# Patient Record
Sex: Male | Born: 1945 | Race: Black or African American | Hispanic: No | Smoking: Current every day smoker
Health system: Southern US, Community
[De-identification: ages and names within clinical notes are randomized; demographics above are authoritative.]

## PROBLEM LIST (undated history)

## (undated) DIAGNOSIS — E785 Hyperlipidemia, unspecified: Secondary | ICD-10-CM

## (undated) DIAGNOSIS — I639 Cerebral infarction, unspecified: Secondary | ICD-10-CM

## (undated) DIAGNOSIS — E119 Type 2 diabetes mellitus without complications: Secondary | ICD-10-CM

## (undated) DIAGNOSIS — I10 Essential (primary) hypertension: Secondary | ICD-10-CM

## (undated) DIAGNOSIS — N400 Enlarged prostate without lower urinary tract symptoms: Secondary | ICD-10-CM

---

## 2015-04-14 ENCOUNTER — Emergency Department (HOSPITAL_COMMUNITY): Payer: Medicare Other

## 2015-04-14 ENCOUNTER — Inpatient Hospital Stay (HOSPITAL_COMMUNITY)
Admission: EM | Admit: 2015-04-14 | Discharge: 2015-04-17 | DRG: 066 | Disposition: A | Discharge status: Hospice | Payer: Medicare Other | Attending: Internal Medicine | Admitting: Internal Medicine

## 2015-04-14 ENCOUNTER — Encounter (HOSPITAL_COMMUNITY): Payer: Self-pay | Admitting: Emergency Medicine

## 2015-04-14 DIAGNOSIS — I69320 Aphasia following cerebral infarction: Secondary | ICD-10-CM

## 2015-04-14 DIAGNOSIS — Z515 Encounter for palliative care: Secondary | ICD-10-CM | POA: Diagnosis present

## 2015-04-14 DIAGNOSIS — Z8249 Family history of ischemic heart disease and other diseases of the circulatory system: Secondary | ICD-10-CM

## 2015-04-14 DIAGNOSIS — Z823 Family history of stroke: Secondary | ICD-10-CM

## 2015-04-14 DIAGNOSIS — F17210 Nicotine dependence, cigarettes, uncomplicated: Secondary | ICD-10-CM | POA: Diagnosis present

## 2015-04-14 DIAGNOSIS — I609 Nontraumatic subarachnoid hemorrhage, unspecified: Secondary | ICD-10-CM | POA: Diagnosis not present

## 2015-04-14 DIAGNOSIS — I629 Nontraumatic intracranial hemorrhage, unspecified: Secondary | ICD-10-CM

## 2015-04-14 DIAGNOSIS — Z800 Family history of malignant neoplasm of digestive organs: Secondary | ICD-10-CM

## 2015-04-14 DIAGNOSIS — E785 Hyperlipidemia, unspecified: Secondary | ICD-10-CM | POA: Diagnosis present

## 2015-04-14 DIAGNOSIS — E119 Type 2 diabetes mellitus without complications: Secondary | ICD-10-CM

## 2015-04-14 DIAGNOSIS — Z993 Dependence on wheelchair: Secondary | ICD-10-CM

## 2015-04-14 DIAGNOSIS — Z7401 Bed confinement status: Secondary | ICD-10-CM

## 2015-04-14 DIAGNOSIS — I10 Essential (primary) hypertension: Secondary | ICD-10-CM | POA: Diagnosis present

## 2015-04-14 HISTORY — DX: Essential (primary) hypertension: I10

## 2015-04-14 HISTORY — DX: Hyperlipidemia, unspecified: E78.5

## 2015-04-14 HISTORY — DX: Benign prostatic hyperplasia without lower urinary tract symptoms: N40.0

## 2015-04-14 HISTORY — DX: Type 2 diabetes mellitus without complications: E11.9

## 2015-04-14 HISTORY — DX: Cerebral infarction, unspecified: I63.9

## 2015-04-14 LAB — I-STAT CHEM 8, ED
BUN: 15 mg/dL (ref 6–20)
CALCIUM ION: 1.08 mmol/L — AB (ref 1.13–1.30)
CHLORIDE: 106 mmol/L (ref 101–111)
Creatinine, Ser: 1.1 mg/dL (ref 0.61–1.24)
GLUCOSE: 167 mg/dL — AB (ref 65–99)
HCT: 50 % (ref 39.0–52.0)
Hemoglobin: 17 g/dL (ref 13.0–17.0)
POTASSIUM: 3.2 mmol/L — AB (ref 3.5–5.1)
Sodium: 144 mmol/L (ref 135–145)
TCO2: 21 mmol/L (ref 0–100)

## 2015-04-14 LAB — COMPREHENSIVE METABOLIC PANEL
ALT: 16 U/L — ABNORMAL LOW (ref 17–63)
ANION GAP: 14 (ref 5–15)
AST: 21 U/L (ref 15–41)
Albumin: 3.6 g/dL (ref 3.5–5.0)
Alkaline Phosphatase: 79 U/L (ref 38–126)
BILIRUBIN TOTAL: 1.1 mg/dL (ref 0.3–1.2)
BUN: 12 mg/dL (ref 6–20)
CO2: 20 mmol/L — ABNORMAL LOW (ref 22–32)
Calcium: 9.2 mg/dL (ref 8.9–10.3)
Chloride: 107 mmol/L (ref 101–111)
Creatinine, Ser: 0.88 mg/dL (ref 0.61–1.24)
GFR calc Af Amer: >60 mL/min (ref >60)
Glucose, Bld: 183 mg/dL — ABNORMAL HIGH (ref 65–99)
POTASSIUM: 3.6 mmol/L (ref 3.5–5.1)
Sodium: 141 mmol/L (ref 135–145)
TOTAL PROTEIN: 6.8 g/dL (ref 6.5–8.1)

## 2015-04-14 LAB — DIFFERENTIAL
Basophils Absolute: 0 10*3/uL (ref 0.0–0.1)
Basophils Relative: 0 %
EOS ABS: 0 10*3/uL (ref 0.0–0.7)
EOS PCT: 1 %
LYMPHS ABS: 1.4 10*3/uL (ref 0.7–4.0)
Lymphocytes Relative: 24 %
MONOS PCT: 4 %
Monocytes Absolute: 0.2 10*3/uL (ref 0.1–1.0)
NEUTROS PCT: 71 %
Neutro Abs: 4.2 10*3/uL (ref 1.7–7.7)

## 2015-04-14 LAB — PROTIME-INR
INR: 1.1 (ref 0.00–1.49)
Prothrombin Time: 14.4 seconds (ref 11.6–15.2)

## 2015-04-14 LAB — I-STAT TROPONIN, ED: TROPONIN I, POC: 0.03 ng/mL (ref 0.00–0.08)

## 2015-04-14 LAB — CBC
HEMATOCRIT: 45.9 % (ref 39.0–52.0)
HEMOGLOBIN: 15.4 g/dL (ref 13.0–17.0)
MCH: 30.3 pg (ref 26.0–34.0)
MCHC: 33.6 g/dL (ref 30.0–36.0)
MCV: 90.4 fL (ref 78.0–100.0)
Platelets: 140 10*3/uL — ABNORMAL LOW (ref 150–400)
RBC: 5.08 MIL/uL (ref 4.22–5.81)
RDW: 12.7 % (ref 11.5–15.5)
WBC: 5.9 10*3/uL (ref 4.0–10.5)

## 2015-04-14 LAB — ETHANOL

## 2015-04-14 LAB — APTT: aPTT: 31 seconds (ref 24–37)

## 2015-04-14 MED ORDER — SODIUM CHLORIDE 0.9 % IV SOLN
1500.0000 mg | Freq: Once | INTRAVENOUS | Status: AC
  Administered 2015-04-14: 1500 mg via INTRAVENOUS
  Filled 2015-04-14: qty 15

## 2015-04-14 MED ORDER — IOHEXOL 350 MG/ML SOLN
50.0000 mL | Freq: Once | INTRAVENOUS | Status: AC | PRN
  Administered 2015-04-14: 50 mL via INTRAVENOUS

## 2015-04-14 MED ORDER — NICARDIPINE HCL IN NACL 20-0.86 MG/200ML-% IV SOLN
3.0000 mg/h | Freq: Once | INTRAVENOUS | Status: AC
  Administered 2015-04-14: 5 mg/h via INTRAVENOUS
  Filled 2015-04-14: qty 200

## 2015-04-14 MED ORDER — MORPHINE SULFATE (PF) 2 MG/ML IV SOLN
2.0000 mg | Freq: Once | INTRAVENOUS | Status: AC
  Administered 2015-04-14: 2 mg via INTRAVENOUS
  Filled 2015-04-14: qty 1

## 2015-04-14 NOTE — ED Notes (Signed)
Patient transported to CT 

## 2015-04-14 NOTE — ED Notes (Signed)
Pt arrives by [facility], family called because pt was signaling that his head was hurting and began vomiting. Family also reports increased lethargy. Pt had a stroke several years ago and is nonverbal with right side deficits at baseline. EMS also reports AMS intermittently. EMS placed 20g in left AC, gave  zofran without relief. Initial BP 214/127, last BP 206/123. CBG 175.

## 2015-04-14 NOTE — ED Provider Notes (Signed)
CSN: [id number]     Arrival date & time 04/14/15  2116 History   First MD Initiated Contact with Patient 04/14/15 2126     Chief Complaint  Patient presents with  . Headache     The history is provided by the EMS personnel and a relative. No language interpreter was used.  Chad Villarreal is a 70 y.o. male who presents to the Emergency Department complaining of headache. History is provided by EMS and family. Per reports patient developed a sudden onset headache about one hour prior to arrival. He has expressive aphasia from prior stroke and is able to nod yes and no. Family states that he vomited and then held his head and appeared to be in pain. When asked if he has a headache he nods yes. He denies chest pain or abdominal pain. No known trauma recently.   Past Medical History  Diagnosis Date  . Stroke (HCC)   . Diabetes mellitus without complication (HCC)   . Hyperlipidemia   . Hypertension   . Enlarged prostate    History reviewed. No pertinent past surgical history. History reviewed. No pertinent family history. Social History  Substance Use Topics  . Smoking status: Current Every Day Smoker -- 0.50 packs/day    Types: Cigarettes  . Smokeless tobacco: None  . Alcohol Use: No    Review of Systems  Reason unable to perform ROS: Altered mental status.      Allergies  Review of patient's allergies indicates no known allergies.  Home Medications   Prior to Admission medications   Medication Sig Start Date End Date Taking? Authorizing Provider  albuterol (PROVENTIL) (2.5 MG/3ML) 0.083% nebulizer solution Take 2.5 mg by nebulization every 6 (six) hours as needed for wheezing or shortness of breath.   Yes Historical Provider, MD  Dextromethorphan-Guaifenesin (MUCINEX DM) 30-600 MG TB12 Take 1 tablet by mouth 2 (two) times daily as needed (FOR MUCUS RELIEF).   Yes Historical Provider, MD  insulin aspart (NOVOLOG) 100 UNIT/ML injection Inject 5 Units into the skin 3 (three) times  daily before meals. SLIDING SCALE   Yes Historical Provider, MD  Insulin Degludec (TRESIBA FLEXTOUCH) 100 UNIT/ML SOPN Inject 20 Units into the skin daily.   Yes Historical Provider, MD  amLODipine (NORVASC) 5 MG tablet Take 5 mg by mouth daily. Reported on 04/14/2015    Historical Provider, MD  atorvastatin (LIPITOR) 20 MG tablet Take 20 mg by mouth daily at 6 PM. Reported on 04/14/2015    Historical Provider, MD  clopidogrel (PLAVIX) 75 MG tablet Take 75 mg by mouth daily. Reported on 04/14/2015    Historical Provider, MD  metFORMIN (GLUCOPHAGE) 1000 MG tablet Take 1,000 mg by mouth 2 (two) times daily with a meal. Reported on 04/14/2015    Historical Provider, MD  Multiple Vitamin (MULTIVITAMIN WITH MINERALS) TABS tablet Take 1 tablet by mouth daily. Reported on 04/14/2015    Historical Provider, MD  Probiotic Product (PROBIOTIC FORMULA) CAPS Take 1 capsule by mouth daily. Reported on 04/14/2015    Historical Provider, MD  sertraline (ZOLOFT) 50 MG tablet Take 50 mg by mouth daily. Reported on 04/14/2015    Historical Provider, MD   BP 148/81 mmHg  Pulse 128  Resp 27  SpO2 99% Physical Exam  Constitutional: He appears well-developed and well-nourished. He appears distressed.  HENT:  Head: Normocephalic and atraumatic.  Cardiovascular: Regular rhythm.   No murmur heard. Tachycardic  Pulmonary/Chest: Effort normal. No respiratory distress.  Rhonchi bilaterally  Abdominal: Soft. There  is no tenderness. There is no rebound and no guarding.  Musculoskeletal: He exhibits no tenderness.  Neurological: He is alert.  Right-sided hemiparesis. Nonverbal. Follows simple one-step commands. 5 out of 5 strength in left upper and left lower extremity.  Skin: Skin is warm and dry.  Psychiatric: He has a normal mood and affect. His behavior is normal.  Nursing note and vitals reviewed.   ED Course  Procedures  CRITICAL CARE Performed by: Tilden Fossa   Total critical care time: 30  minutes  Critical care time was exclusive of separately billable procedures and treating other patients.  Critical care was necessary to treat or prevent imminent or life-threatening deterioration.  Critical care was time spent personally by me on the following activities: development of treatment plan with patient and/or surrogate as well as nursing, discussions with consultants, evaluation of patient's response to treatment, examination of patient, obtaining history from patient or surrogate, ordering and performing treatments and interventions, ordering and review of laboratory studies, ordering and review of radiographic studies, pulse oximetry and re-evaluation of patient's condition.  Labs Review Labs Reviewed  CBC - Abnormal; Notable for the following:    Platelets 140 (*)    All other components within normal limits  COMPREHENSIVE METABOLIC PANEL - Abnormal; Notable for the following:    CO2 20 (*)    Glucose, Bld 183 (*)    ALT 16 (*)    All other components within normal limits  I-STAT CHEM 8, ED - Abnormal; Notable for the following:    Potassium 3.2 (*)    Glucose, Bld 167 (*)    Calcium, Ion 1.08 (*)    All other components within normal limits  ETHANOL  PROTIME-INR  APTT  DIFFERENTIAL  URINE RAPID DRUG SCREEN, HOSP PERFORMED  URINALYSIS, ROUTINE W REFLEX MICROSCOPIC (NOT AT [facility])  I-STAT TROPOININ, ED    Imaging Review Ct Angio Head W/cm &/or Wo Cm  04/14/2015  CLINICAL DATA:  Follow-up intracranial hemorrhage. History of diabetes, hypertension and hyperlipidemia. EXAM: CT ANGIOGRAPHY HEAD TECHNIQUE: Multidetector CT imaging of the head was performed using the standard protocol during bolus administration of intravenous contrast. Multiplanar CT image reconstructions and MIPs were obtained to evaluate the vascular anatomy. CONTRAST:  50mL OMNIPAQUE IOHEXOL 350 MG/ML SOLN COMPARISON:  CT head April 14, 2015 at 2146 hours FINDINGS: Anterior circulation: LEFT internal  carotid artery is occluded throughout the included cervical and intracranial segments. Small amount of retrograde flow LEFT carotid terminus. Moderate calcific atherosclerosis of RIGHT carotid bulb, with moderate stenosis RIGHT cavernous segment. Moderate luminal irregularity patent RIGHT A1 segment, with focal high-grade stenosis RIGHT anterior cerebral artery. High-grade stenosis versus occluded LEFT A1 segment, difficult to assess due to subarachnoid hemorrhage. Moderate luminal irregularity RIGHT middle cerebral artery, with tandem high-grade stenosis RIGHT M2 segment. Moderate to high-grade stenosis LEFT M1 origin, with tandem high-grade stenosis LEFT M2 and M3 segments. Posterior circulation: Occluded RIGHT vertebral artery, from included RIGHT V3 through RIGHT V4 segment, with minimal retrograde flow distal RIGHT V4 segment. High-grade stenosis distal LEFT V4 segment. Basilar artery occlusion with retrograde flow via probable small LEFT posterior communicating artery. High-grade stenosis distal basilar artery. Thready flow bilateral posterior cerebral arteries without large vessel occlusion. 7 mm LEFT inferior frontal lobe rounded density with nipple sign posteriorly directed extending into the LEFT olfactory recess. IMPRESSION: 7 mm LEFT inferior frontal lobe density with nipple sign compatible with source of hemorrhage. Consider location this is concerning for pseudo aneurysm, less likely vascular malformation. Occluded  RIGHT internal carotid artery. Occluded RIGHT vertebral artery. Occluded basilar artery. Patent LEFT posterior communicating artery. Multifocal high-grade stenosis compatible with atherosclerosis. Electronically Signed   By: Awilda Metro M.D.   On: 04/14/2015 23:04   Ct Head Wo Contrast  04/14/2015  CLINICAL DATA:  Headache, vomiting and lethargy. History of old stroke, RIGHT-sided deficits at baseline. History of hypertension, hyperlipidemia and diabetes. EXAM: CT HEAD WITHOUT  CONTRAST TECHNIQUE: Contiguous axial images were obtained from the base of the skull through the vertex without intravenous contrast. COMPARISON:  None. FINDINGS: Large amount of subarachnoid hemorrhage about the circle of Willis, extending into the LEFT greater than RIGHT sylvian fissures, and along the interhemispheric fissure. Acute bilateral dense frontal subdural hematomas measuring up to 4 mm without mass effect. 3.9 x 1 cm LEFT inferior frontal lobe intraparenchymal hematoma. No midline shift. Large area of LEFT frontoparietal encephalomalacia with ex vacuo dilatation LEFT lateral ventricle, no hydrocephalus. Probable LEFT cerebral peduncle volume loss compatible with wallerian degeneration. Patchy supratentorial white matter hypodensities exclusive of the aforementioned abnormality. Moderate calcific atherosclerosis of the carotid siphons and included vertebral arteries. No skull fracture. Sessile LEFT frontal bony excrescence measuring up to 6 mm in depth without mass effect may represent meningioma. Imaged paranasal sinuses and mastoid air cells are well aerated. Ocular globes and orbital contents are normal. IMPRESSION: Large amount of subarachnoid hemorrhage about the circle of Willis extends into the interhemispheric fissure suggesting ruptured anterior communicating artery aneurysm. 3.9 x 1 cm LEFT inferior frontal lobe intraparenchymal hematoma, which may be posttraumatic or, intraparenchymal dissection of ruptured aneurysm. 4 mm bifrontal acute subdural hematomas without mass effect. Acute findings discussed with and reconfirmed by [facility] Jonte Wollam on 04/14/2015 at 9:55 pm. Electronically Signed   By: Awilda Metro M.D.   On: 04/14/2015 21:59   Dg Chest Port 1 View  04/14/2015  CLINICAL DATA:  Chest pain and altered mental status EXAM: PORTABLE CHEST 1 VIEW COMPARISON:  None. FINDINGS: Cardiac shadow is enlarged. The lungs are well aerated with mild vascular congestion. Mild increased density  is noted in the medial right lung base likely representing atelectasis or early infiltrate. No bony abnormality is noted. IMPRESSION: Mild vascular congestion and early right basilar changes. Electronically Signed   By: Alcide Clever M.D.   On: 04/14/2015 21:56   I have personally reviewed and evaluated these images and lab results as part of my medical decision-making.   EKG Interpretation   Date/Time:  Friday April 14 2015 21:29:12 EST Ventricular Rate:  127 PR Interval:  145 QRS Duration: 89 QT Interval:  322 QTC Calculation: 468 R Axis:   -29 Text Interpretation:  Sinus tachycardia LAE, consider biatrial enlargement  Probable left ventricular hypertrophy Borderline T abnormalities, diffuse  leads Confirmed by Lincoln Brigham [phone]) on 04/14/2015 9:42:17 PM      MDM   Final diagnoses:  SAH (subarachnoid hemorrhage) (HCC)    Patient here for violation of sudden onset headache. He is ill-appearing on initial evaluation. CT head is consistent with acute subarachnoid hemorrhage, concern for aneurysmal hemorrhage. He was started on a Cardene drip for blood pressure control, Keppra for seizure prophylaxis. Neurosurgery consulted for further management recommendations. On repeat evaluation he is slightly more somnolent but he does arouse to verbal stimuli.  Neurosurgery has evaluated the patient and there are limited interventions available. After discussions with the family decision has been made to transition the patient to comfort and palliative care. Medicine consultation for admission for comfort care.  Tilden Fossa, MD 04/15/15 [phone]

## 2015-04-14 NOTE — Consult Note (Signed)
CC:  Chief Complaint  Patient presents with  . Headache    HPI: Chad Villarreal is a 70 y.o. 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  He began making motions consistent with headache today and had transient episodes of decreased consciousness.  He was found to have a subarachnoid hemorrhage from a left frontoorbital pseudoaneurysm.  PMH: Past Medical History  Diagnosis Date  . Stroke (HCC)   . Diabetes mellitus without complication (HCC)   . Hyperlipidemia   . Hypertension   . Enlarged prostate     PSH: History reviewed. No pertinent past surgical history.  SH: Social History  Substance Use Topics  . Smoking status: Current Every Day Smoker -- 0.50 packs/day    Types: Cigarettes  . Smokeless tobacco: None  . Alcohol Use: No    MEDS: Prior to Admission medications   Medication Sig Start Date End Date Taking? Authorizing Provider  albuterol (PROVENTIL) (2.5 MG/3ML) 0.083% nebulizer solution Take 2.5 mg by nebulization every 6 (six) hours as needed for wheezing or shortness of breath.   Yes Historical Provider, MD  Dextromethorphan-Guaifenesin (MUCINEX DM) 30-600 MG TB12 Take 1 tablet by mouth 2 (two) times daily as needed (FOR MUCUS RELIEF).   Yes Historical Provider, MD  insulin aspart (NOVOLOG) 100 UNIT/ML injection Inject 5 Units into the skin 3 (three) times daily before meals. SLIDING SCALE   Yes Historical Provider, MD  Insulin Degludec (TRESIBA FLEXTOUCH) 100 UNIT/ML SOPN Inject 20 Units into the skin daily.   Yes Historical Provider, MD  amLODipine (NORVASC) 5 MG tablet Take 5 mg by mouth daily. Reported on 04/14/2015    Historical Provider, MD  atorvastatin (LIPITOR) 20 MG tablet Take 20 mg by mouth daily at 6 PM. Reported on 04/14/2015    Historical Provider, MD  clopidogrel (PLAVIX) 75 MG tablet Take 75 mg by mouth daily. Reported on 04/14/2015    Historical Provider, MD  metFORMIN (GLUCOPHAGE) 1000 MG tablet Take 1,000 mg by mouth 2 (two) times  daily with a meal. Reported on 04/14/2015    Historical Provider, MD  Multiple Vitamin (MULTIVITAMIN WITH MINERALS) TABS tablet Take 1 tablet by mouth daily. Reported on 04/14/2015    Historical Provider, MD  Probiotic Product (PROBIOTIC FORMULA) CAPS Take 1 capsule by mouth daily. Reported on 04/14/2015    Historical Provider, MD  sertraline (ZOLOFT) 50 MG tablet Take 50 mg by mouth daily. Reported on 04/14/2015    Historical Provider, MD    ALLERGY: No Known Allergies  ROS: ROS  Unable to obtain because patient is aphasic  NEUROLOGIC EXAM: Awake, fluctuating alertness Nods appropriately in response to family members PERRL Moves purposefully on the left, dense right hemiparesis with contractures  IMAGING: Subarachnoid hemorrhage and left inferior frontal parenchymal hemorrhage Anterior inferior left frontal pseudoaneurysm.  Left carotid occlusion.  Right vertebral artery occlusion.  IMPRESSION: - 70 y.o. male with suabarachnoid hemorrhage due to frontal pseudoaneurysm.  Given his poor cerebral vasculature and multiple comorbidities as well as the unusual, difficult location of this aneurysm he is not a reasonable surgical candidate.  Family discussed this with him and he indicated to them that he does not want any aggressive treatment.  I think this is a reasonable decision.  I answered all of their questions.  PLAN: - Palliative care - No surgical intervention

## 2015-04-15 ENCOUNTER — Encounter (HOSPITAL_COMMUNITY): Payer: Self-pay | Admitting: Internal Medicine

## 2015-04-15 DIAGNOSIS — E119 Type 2 diabetes mellitus without complications: Secondary | ICD-10-CM

## 2015-04-15 DIAGNOSIS — E785 Hyperlipidemia, unspecified: Secondary | ICD-10-CM | POA: Diagnosis present

## 2015-04-15 DIAGNOSIS — Z8249 Family history of ischemic heart disease and other diseases of the circulatory system: Secondary | ICD-10-CM | POA: Diagnosis not present

## 2015-04-15 DIAGNOSIS — Z993 Dependence on wheelchair: Secondary | ICD-10-CM | POA: Diagnosis not present

## 2015-04-15 DIAGNOSIS — I609 Nontraumatic subarachnoid hemorrhage, unspecified: Secondary | ICD-10-CM | POA: Diagnosis present

## 2015-04-15 DIAGNOSIS — Z515 Encounter for palliative care: Secondary | ICD-10-CM | POA: Diagnosis present

## 2015-04-15 DIAGNOSIS — Z823 Family history of stroke: Secondary | ICD-10-CM | POA: Diagnosis not present

## 2015-04-15 DIAGNOSIS — I10 Essential (primary) hypertension: Secondary | ICD-10-CM

## 2015-04-15 DIAGNOSIS — I629 Nontraumatic intracranial hemorrhage, unspecified: Secondary | ICD-10-CM

## 2015-04-15 DIAGNOSIS — Z800 Family history of malignant neoplasm of digestive organs: Secondary | ICD-10-CM | POA: Diagnosis not present

## 2015-04-15 DIAGNOSIS — Z7401 Bed confinement status: Secondary | ICD-10-CM | POA: Diagnosis not present

## 2015-04-15 DIAGNOSIS — F17210 Nicotine dependence, cigarettes, uncomplicated: Secondary | ICD-10-CM | POA: Diagnosis present

## 2015-04-15 DIAGNOSIS — I69320 Aphasia following cerebral infarction: Secondary | ICD-10-CM | POA: Diagnosis not present

## 2015-04-15 MED ORDER — ACETAMINOPHEN 325 MG PO TABS: 650.0000 mg | ORAL_TABLET | Freq: Four times a day (QID) | ORAL | Status: DC | PRN

## 2015-04-15 MED ORDER — ACETAMINOPHEN 650 MG RE SUPP: 650.0000 mg | Freq: Four times a day (QID) | RECTAL | Status: DC | PRN

## 2015-04-15 MED ORDER — SODIUM CHLORIDE 0.9% FLUSH: 3.0000 mL | INTRAVENOUS | Status: DC | PRN

## 2015-04-15 MED ORDER — SODIUM CHLORIDE 0.9 % IV SOLN: 250.0000 mL | INTRAVENOUS | Status: DC | PRN

## 2015-04-15 MED ORDER — SODIUM CHLORIDE 0.9 % IV SOLN
500.0000 mg | Freq: Two times a day (BID) | INTRAVENOUS | Status: DC
  Administered 2015-04-15 – 2015-04-16 (×4): 500 mg via INTRAVENOUS
  Filled 2015-04-15 (×5): qty 5

## 2015-04-15 MED ORDER — MORPHINE SULFATE (PF) 2 MG/ML IV SOLN
2.0000 mg | INTRAVENOUS | Status: DC | PRN
Start: 2015-04-15 — End: 2015-04-17
  Administered 2015-04-15: 2 mg via INTRAVENOUS
  Administered 2015-04-16 (×2): 4 mg via INTRAVENOUS
  Filled 2015-04-15 (×2): qty 2
  Filled 2015-04-15: qty 1

## 2015-04-15 MED ORDER — ONDANSETRON HCL 4 MG/2ML IJ SOLN
4.0000 mg | Freq: Four times a day (QID) | INTRAMUSCULAR | Status: DC | PRN
  Administered 2015-04-15: 4 mg via INTRAVENOUS
  Filled 2015-04-15: qty 2

## 2015-04-15 MED ORDER — ONDANSETRON HCL 4 MG PO TABS
4.0000 mg | ORAL_TABLET | Freq: Four times a day (QID) | ORAL | Status: DC | PRN
Start: 2015-04-15 — End: 2015-04-17

## 2015-04-15 MED ORDER — ALBUTEROL SULFATE (2.5 MG/3ML) 0.083% IN NEBU: 2.5000 mg | INHALATION_SOLUTION | RESPIRATORY_TRACT | Status: DC | PRN

## 2015-04-15 MED ORDER — LORAZEPAM 2 MG/ML IJ SOLN: 0.5000 mg | INTRAMUSCULAR | Status: DC | PRN

## 2015-04-15 MED ORDER — SODIUM CHLORIDE 0.9% FLUSH
3.0000 mL | Freq: Two times a day (BID) | INTRAVENOUS | Status: DC
  Administered 2015-04-15 – 2015-04-16 (×2): 3 mL via INTRAVENOUS

## 2015-04-15 NOTE — Progress Notes (Signed)
PROGRESS NOTE    Chad Villarreal  WJX:[id number]  DOB: Nov 15, 1945  DOA: 04/14/2015 PCP: No primary care provider on file. Outpatient Specialists:   Hospital course: 70 year old male with history of prior stroke, right hemiparesis & aphagia, bed bound for several years, DM, HTN, HLD, presented to [facility] ED on 2/24 with complaints suggestive of headache and decreased consciousness. 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 Neurosurgery consulted in ED and determined that he was not the reasonable surgical candidate. Family discussed this with patient and he indicated to them that he did not want any aggressive treatment. Patient was transitioned to comfort care.   Assessment & Plan:   Subarachnoid hemorrhage secondary to left frontal orbital pseudoaneurysm - Neurosurgery evaluated in ED and patient not a surgical candidate. Transitioned to palliative care. Will request social work consultation for home hospice.  Hypertension - No aggressive intervention  DM - No aggressive intervention  DVT prophylaxis: SCDs Code Status: DO NOT RESUSCITATE Family Communication: Discussed with spouse at bedside Disposition Plan: Home with hospice   Consultants:  Neurosurgery  Procedures:  None  Antimicrobials:  None   Subjective: Patient sleeping and barely arousable. As per spouse at bedside, intermittently wakes up and still recognizes her.  Objective: Filed Vitals:   04/15/15 0100 04/15/15 0130 04/15/15 0225 04/15/15 1456  BP: 155/100 133/81 163/98 134/92  Pulse: 131 127 125 118  Temp:   97.9 F (36.6 C) 98.2 F (36.8 C)  TempSrc:   Axillary Oral  Resp: 29 21 20 18   Weight:   83 kg (182 lb 15.7 oz)   SpO2: 99% 98% 100% 100%    Intake/Output Summary (Last 24 hours) at 04/15/15 1903 Last data filed at 04/15/15 1902  Gross per 24 hour  Intake    462 ml  Output   1150 ml  Net   -688 ml   Filed Weights   04/15/15 0225  Weight: 83  kg (182 lb 15.7 oz)    Exam:  General exam: Elderly male lying comfortably in bed. No distress noted. Respiratory system: Reduced breath sounds. No increased work of breathing. Cardiovascular system: S1 & S2 heard, RRR. No JVD, murmurs, gallops, clicks or pedal edema. Gastrointestinal system: Abdomen is nondistended, soft and nontender. Normal bowel sounds heard. Central nervous system: Sleeping and barely arousable.  Extremities: Symmetric 5 x 5 power. Contracted right upper extremity   Data Reviewed: Basic Metabolic Panel:  Recent Labs Lab 04/14/15 2146 04/14/15 2305  NA 144 141  K 3.2* 3.6  CL 106 107  CO2  --  20*  GLUCOSE 167* 183*  BUN 15 12  CREATININE 1.10 0.88  CALCIUM  --  9.2   Liver Function Tests:  Recent Labs Lab 04/14/15 2305  AST 21  ALT 16*  ALKPHOS 79  BILITOT 1.1  PROT 6.8  ALBUMIN 3.6   No results for input(s): LIPASE, AMYLASE in the last 168 hours. No results for input(s): AMMONIA in the last 168 hours. CBC:  Recent Labs Lab 04/14/15 2146 04/14/15 2305  WBC  --  5.9  NEUTROABS  --  4.2  HGB 17.0 15.4  HCT 50.0 45.9  MCV  --  90.4  PLT  --  140*   Cardiac Enzymes: No results for input(s): CKTOTAL, CKMB, CKMBINDEX, TROPONINI in the last 168 hours. BNP (last 3 results) No results for input(s): PROBNP in the last 8760 hours. CBG: No results for input(s): GLUCAP in the last 168 hours.  No  results found for this or any previous visit (from the past 240 hour(s)).       Studies: Ct Angio Head W/cm &/or Wo Cm  04/14/2015  CLINICAL DATA:  Follow-up intracranial hemorrhage. History of diabetes, hypertension and hyperlipidemia. EXAM: CT ANGIOGRAPHY HEAD TECHNIQUE: Multidetector CT imaging of the head was performed using the standard protocol during bolus administration of intravenous contrast. Multiplanar CT image reconstructions and MIPs were obtained to evaluate the vascular anatomy. CONTRAST:  50mL OMNIPAQUE IOHEXOL 350 MG/ML SOLN  COMPARISON:  CT head April 14, 2015 at 2146 hours FINDINGS: Anterior circulation: LEFT internal carotid artery is occluded throughout the included cervical and intracranial segments. Small amount of retrograde flow LEFT carotid terminus. Moderate calcific atherosclerosis of RIGHT carotid bulb, with moderate stenosis RIGHT cavernous segment. Moderate luminal irregularity patent RIGHT A1 segment, with focal high-grade stenosis RIGHT anterior cerebral artery. High-grade stenosis versus occluded LEFT A1 segment, difficult to assess due to subarachnoid hemorrhage. Moderate luminal irregularity RIGHT middle cerebral artery, with tandem high-grade stenosis RIGHT M2 segment. Moderate to high-grade stenosis LEFT M1 origin, with tandem high-grade stenosis LEFT M2 and M3 segments. Posterior circulation: Occluded RIGHT vertebral artery, from included RIGHT V3 through RIGHT V4 segment, with minimal retrograde flow distal RIGHT V4 segment. High-grade stenosis distal LEFT V4 segment. Basilar artery occlusion with retrograde flow via probable small LEFT posterior communicating artery. High-grade stenosis distal basilar artery. Thready flow bilateral posterior cerebral arteries without large vessel occlusion. 7 mm LEFT inferior frontal lobe rounded density with nipple sign posteriorly directed extending into the LEFT olfactory recess. IMPRESSION: 7 mm LEFT inferior frontal lobe density with nipple sign compatible with source of hemorrhage. Consider location this is concerning for pseudo aneurysm, less likely vascular malformation. 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 Patent LEFT posterior communicating artery. Multifocal high-grade stenosis compatible with atherosclerosis. Electronically Signed   By: Awilda Metro M.D.   On: 04/14/2015 23:04   Ct Head Wo Contrast  04/14/2015  CLINICAL DATA:  Headache, vomiting and lethargy. History of old stroke, RIGHT-sided  deficits at baseline. History of hypertension, hyperlipidemia and diabetes. EXAM: CT HEAD WITHOUT CONTRAST TECHNIQUE: Contiguous axial images were obtained from the base of the skull through the vertex without intravenous contrast. COMPARISON:  None. FINDINGS: Large amount of subarachnoid hemorrhage about the circle of Willis, extending into the LEFT greater than RIGHT sylvian fissures, and along the interhemispheric fissure. Acute bilateral dense frontal subdural hematomas measuring up to 4 mm without mass effect. 3.9 x 1 cm LEFT inferior frontal lobe intraparenchymal hematoma. No midline shift. Large area of LEFT frontoparietal encephalomalacia with ex vacuo dilatation LEFT lateral ventricle, no hydrocephalus. Probable LEFT cerebral peduncle volume loss compatible with wallerian degeneration. Patchy supratentorial white matter hypodensities exclusive of the aforementioned abnormality. Moderate calcific atherosclerosis of the carotid siphons and included vertebral arteries. No skull fracture. Sessile LEFT frontal bony excrescence measuring up to 6 mm in depth without mass effect may represent meningioma. Imaged paranasal sinuses and mastoid air cells are well aerated. Ocular globes and orbital contents are normal. IMPRESSION: Large amount of subarachnoid hemorrhage about the circle of Willis extends into the interhemispheric fissure suggesting ruptured anterior communicating artery aneurysm. 3.9 x 1 cm LEFT inferior frontal lobe intraparenchymal hematoma, which may be posttraumatic or, intraparenchymal dissection of ruptured aneurysm. 4 mm bifrontal acute subdural hematomas without mass effect. Acute findings discussed with and reconfirmed by [facility] on 04/14/2015 at 9:55 pm. Electronically Signed   By: Michel Santee.D.  On: 04/14/2015 21:59   Dg Chest Port 1 View  04/14/2015  CLINICAL DATA:  Chest pain and altered mental status EXAM: PORTABLE CHEST 1 VIEW COMPARISON:  None. FINDINGS: Cardiac  shadow is enlarged. The lungs are well aerated with mild vascular congestion. Mild increased density is noted in the medial right lung base likely representing atelectasis or early infiltrate. No bony abnormality is noted. IMPRESSION: Mild vascular congestion and early right basilar changes. Electronically Signed   By: Alcide Clever M.D.   On: 04/14/2015 21:56        Scheduled Meds: . levETIRAcetam  500 mg Intravenous Q12H  . sodium chloride flush  3 mL Intravenous Q12H   Continuous Infusions:   Active Problems:   Diabetes mellitus (HCC)   Hypertension   Intracranial bleed (HCC)   SAH (subarachnoid hemorrhage) (HCC)   Subarachnoid bleed (HCC)    Time spent: 20 minutes    Raylyn Carton, MD, FACP, FHM. [facility] Pager [phone]  If 7PM-7AM, please contact night-coverage [email or web address] Password [email or web address] 04/15/2015, 7:03 PM    LOS: 0 days

## 2015-04-15 NOTE — H&P (Signed)
PCP: No primary care provider on file.    Referring provider Pecola Leisure   Chief Complaint:  Nausea vomiting headache  HPI: Chad Villarreal is a 70 y.o. male   has a past medical history of Stroke (HCC); Diabetes mellitus without complication (HCC); Hyperlipidemia; Hypertension; and Enlarged prostate.   Presented with what appeared to be headache AND episode of vomiting. 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 Neurosurgery has been consulted but given for cerebrovascular Gen. spoke be due to his father not to be candidate for any surgical intervention or coiling. His was also discussed with the patient himself who indicated he did not want any aggressive intervention and patient wishes at this point to have comfort care only  IN ER: 7 mm left inferior frontal lobe density with compatible with source of hemorrhage most likely vascular formation She was started on nicardipine drip but this was discontinued when decision was made for patient to have comfort care only  Regarding pertinent past history: Asian baseline has history of aphasia but able to be functional and answering yes and no past history of RIGHThemiparesis secondary to prior stroke. Patient has history of  diabetes   Hospitalist was called for admission for subarachnoid bleed  Review of Systems:  Patient nonverbal and unable to obtain Past Medical History: Past Medical History  Diagnosis Date  . Stroke (HCC)   . Diabetes mellitus without complication (HCC)   . Hyperlipidemia   . Hypertension   . Enlarged prostate    History reviewed. No pertinent past surgical history.   Medications: Prior to Admission medications   Medication Sig Start Date End Date Taking? Authorizing Provider  albuterol (PROVENTIL) (2.5 MG/3ML) 0.083% nebulizer  solution Take 2.5 mg by nebulization every 6 (six) hours as needed for wheezing or shortness of breath.   Yes Historical Provider, MD  Dextromethorphan-Guaifenesin (MUCINEX DM) 30-600 MG TB12 Take 1 tablet by mouth 2 (two) times daily as needed (FOR MUCUS RELIEF).   Yes Historical Provider, MD  insulin aspart (NOVOLOG) 100 UNIT/ML injection Inject 5 Units into the skin 3 (three) times daily before meals. SLIDING SCALE   Yes Historical Provider, MD  Insulin Degludec (TRESIBA FLEXTOUCH) 100 UNIT/ML SOPN Inject 20 Units into the skin daily.   Yes Historical Provider, MD  amLODipine (NORVASC) 5 MG tablet Take 5 mg by mouth daily. Reported on 04/14/2015    Historical Provider, MD  atorvastatin (LIPITOR) 20 MG tablet Take 20 mg by mouth daily at 6 PM. Reported on 04/14/2015    Historical Provider, MD  clopidogrel (PLAVIX) 75 MG tablet Take 75 mg by mouth daily. Reported on 04/14/2015    Historical Provider, MD  metFORMIN (GLUCOPHAGE) 1000 MG tablet Take 1,000 mg by mouth 2 (two) times daily with a meal. Reported on 04/14/2015    Historical Provider, MD  Multiple Vitamin (MULTIVITAMIN WITH MINERALS) TABS tablet Take 1 tablet by mouth daily. Reported on 04/14/2015    Historical Provider, MD  Probiotic Product (PROBIOTIC FORMULA) CAPS Take 1 capsule by mouth daily. Reported on 04/14/2015    Historical Provider, MD  sertraline (ZOLOFT) 50 MG tablet Take 50 mg by mouth daily. Reported on 04/14/2015    Historical Provider, MD    Allergies:  No Known Allergies  Social History:  Ambulatory wheelchair bound  Lives at home With family  reports that he has been smoking Cigarettes.  He has been smoking about 0.50 packs per day. He does not have any smokeless tobacco history on file. He reports that he does not drink alcohol or use illicit drugs.     Family History: family history includes Colon cancer in his mother; Hypertension in his mother; Stroke in his other.    Physical Exam: Patient Vitals for the  past 24 hrs:  BP Pulse Resp SpO2  04/14/15 2345 148/81 mmHg (!) 128 (!) 27 99 %  04/14/15 2331 138/84 mmHg (!) 129 (!) 31 99 %  04/14/15 2315 158/77 mmHg (!) 130 22 99 %  04/14/15 2307 142/69 mmHg (!) 130 (!) 27 98 %  04/14/15 2257 157/91 mmHg 66 (!) 30 99 %  04/14/15 2236 176/100 mmHg (!) 127 (!) 30 94 %  04/14/15 2220 158/90 mmHg 63 (!) 36 95 %  04/14/15 2218 167/93 mmHg - - 99 %  04/14/15 2211 198/97 mmHg - - -  04/14/15 2209 198/97 mmHg (!) 125 (!) 36 97 %  04/14/15 2202 - (!) 123 (!) 30 97 %  04/14/15 2201 (!) 180/106 mmHg - - -    1. General:  in No Acute distress 2. Psychological: Somnolent not Oriented 3. Head/ENT:    Dry Mucous Membranes                          Head Non traumatic, neck supple                          Normal  Dentition 4. SKIN: normal  Skin turgor,  Skin clean Dry and intact no rash 5. Heart: Regular rate and rhythm no Murmur, Rub or gallop 6. Lungs: Clear to auscultation bilaterally, no wheezes or crackles   7. Abdomen: Soft, non-tender, Non distended 8. Lower extremities: no clubbing, cyanosis, or edema 9. Neurologically chronic right hemiparesis with contractures  10. MSK: Normal range of motion  body mass index is unknown because there is no height or weight on file.   Labs on Admission:   Results for orders placed or performed during the hospital encounter of 04/14/15 (from the past 24 hour(s))  I-stat troponin, ED (not at [facility], [facility])     Status: None   Collection Time: 04/14/15  9:40 PM  Result Value Ref Range   Troponin i, poc 0.03 0.00 - 0.08 ng/mL   Comment 3          I-Stat Chem 8, ED  (not at [facility], [facility])     Status: Abnormal   Collection Time: 04/14/15  9:46 PM  Result Value Ref Range   Sodium 144 135 - 145 mmol/L   Potassium 3.2 (L) 3.5 - 5.1 mmol/L   Chloride 106 101 - 111 mmol/L   BUN 15 6 - 20 mg/dL   Creatinine, Ser 1.61 0.61 - 1.24 mg/dL   Glucose, Bld 096 (H) 65 - 99 mg/dL   Calcium, Ion 0.45 (L) 1.13 - 1.30 mmol/L   TCO2 21  0 - 100 mmol/L   Hemoglobin 17.0 13.0 - 17.0 g/dL   HCT 40.9 81.1 - 91.4 %  Ethanol     Status: None   Collection Time: 04/14/15 11:05 PM  Result Value Ref Range   Alcohol, Ethyl (B) <5 <5 mg/dL  Protime-INR     Status: None   Collection Time: 04/14/15 11:05 PM  Result Value Ref Range  Prothrombin Time 14.4 11.6 - 15.2 seconds   INR 1.10 0.00 - 1.49  APTT     Status: None   Collection Time: 04/14/15 11:05 PM  Result Value Ref Range   aPTT 31 24 - 37 seconds  CBC     Status: Abnormal   Collection Time: 04/14/15 11:05 PM  Result Value Ref Range   WBC 5.9 4.0 - 10.5 K/uL   RBC 5.08 4.22 - 5.81 MIL/uL   Hemoglobin 15.4 13.0 - 17.0 g/dL   HCT 16.1 09.6 - 04.5 %   MCV 90.4 78.0 - 100.0 fL   MCH 30.3 26.0 - 34.0 pg   MCHC 33.6 30.0 - 36.0 g/dL   RDW 40.9 81.1 - 91.4 %   Platelets 140 (L) 150 - 400 K/uL  Differential     Status: None   Collection Time: 04/14/15 11:05 PM  Result Value Ref Range   Neutrophils Relative % 71 %   Neutro Abs 4.2 1.7 - 7.7 K/uL   Lymphocytes Relative 24 %   Lymphs Abs 1.4 0.7 - 4.0 K/uL   Monocytes Relative 4 %   Monocytes Absolute 0.2 0.1 - 1.0 K/uL   Eosinophils Relative 1 %   Eosinophils Absolute 0.0 0.0 - 0.7 K/uL   Basophils Relative 0 %   Basophils Absolute 0.0 0.0 - 0.1 K/uL  Comprehensive metabolic panel     Status: Abnormal   Collection Time: 04/14/15 11:05 PM  Result Value Ref Range   Sodium 141 135 - 145 mmol/L   Potassium 3.6 3.5 - 5.1 mmol/L   Chloride 107 101 - 111 mmol/L   CO2 20 (L) 22 - 32 mmol/L   Glucose, Bld 183 (H) 65 - 99 mg/dL   BUN 12 6 - 20 mg/dL   Creatinine, Ser 7.82 0.61 - 1.24 mg/dL   Calcium 9.2 8.9 - 95.6 mg/dL   Total Protein 6.8 6.5 - 8.1 g/dL   Albumin 3.6 3.5 - 5.0 g/dL   AST 21 15 - 41 U/L   ALT 16 (L) 17 - 63 U/L   Alkaline Phosphatase 79 38 - 126 U/L   Total Bilirubin 1.1 0.3 - 1.2 mg/dL   GFR calc non Af Amer >60 >60 mL/min   GFR calc Af Amer >60 >60 mL/min   Anion gap 14 5 - 15    UA not  obtained  No results found for: HGBA1C  CrCl cannot be calculated (Unknown ideal weight.).  BNP (last 3 results) No results for input(s): PROBNP in the last 8760 hours.  Other results:     There were no vitals filed for this visit.   Cultures: No results found for: SDES, SPECREQUEST, CULT, REPTSTATUS   Radiological Exams on Admission: Ct Angio Head W/cm &/or Wo Cm  04/14/2015  CLINICAL DATA:  Follow-up intracranial hemorrhage. History of diabetes, hypertension and hyperlipidemia. EXAM: CT ANGIOGRAPHY HEAD TECHNIQUE: Multidetector CT imaging of the head was performed using the standard protocol during bolus administration of intravenous contrast. Multiplanar CT image reconstructions and MIPs were obtained to evaluate the vascular anatomy. CONTRAST:  50mL OMNIPAQUE IOHEXOL 350 MG/ML SOLN COMPARISON:  CT head April 14, 2015 at 2146 hours FINDINGS: Anterior circulation: LEFT internal carotid artery is occluded throughout the included cervical and intracranial segments. Small amount of retrograde flow LEFT carotid terminus. Moderate calcific atherosclerosis of RIGHT carotid bulb, with moderate stenosis RIGHT cavernous segment. Moderate luminal irregularity patent RIGHT A1 segment, with focal high-grade stenosis RIGHT anterior cerebral artery. High-grade stenosis versus occluded  LEFT A1 segment, difficult to assess due to subarachnoid hemorrhage. Moderate luminal irregularity RIGHT middle cerebral artery, with tandem high-grade stenosis RIGHT M2 segment. Moderate to high-grade stenosis LEFT M1 origin, with tandem high-grade stenosis LEFT M2 and M3 segments. Posterior circulation: Occluded RIGHT vertebral artery, from included RIGHT V3 through RIGHT V4 segment, with minimal retrograde flow distal RIGHT V4 segment. High-grade stenosis distal LEFT V4 segment. Basilar artery occlusion with retrograde flow via probable small LEFT posterior communicating artery. High-grade stenosis distal basilar artery.  Thready flow bilateral posterior cerebral arteries without large vessel occlusion. 7 mm LEFT inferior frontal lobe rounded density with nipple sign posteriorly directed extending into the LEFT olfactory recess. IMPRESSION: 7 mm LEFT inferior frontal lobe density with nipple sign compatible with source of hemorrhage. Consider location this is concerning for pseudo aneurysm, less likely vascular malformation. 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 Patent LEFT posterior communicating artery. Multifocal high-grade stenosis compatible with atherosclerosis. Electronically Signed   By: Awilda Metro M.D.   On: 04/14/2015 23:04   Ct Head Wo Contrast  04/14/2015  CLINICAL DATA:  Headache, vomiting and lethargy. History of old stroke, RIGHT-sided deficits at baseline. History of hypertension, hyperlipidemia and diabetes. EXAM: CT HEAD WITHOUT CONTRAST TECHNIQUE: Contiguous axial images were obtained from the base of the skull through the vertex without intravenous contrast. COMPARISON:  None. FINDINGS: Large amount of subarachnoid hemorrhage about the circle of Willis, extending into the LEFT greater than RIGHT sylvian fissures, and along the interhemispheric fissure. Acute bilateral dense frontal subdural hematomas measuring up to 4 mm without mass effect. 3.9 x 1 cm LEFT inferior frontal lobe intraparenchymal hematoma. No midline shift. Large area of LEFT frontoparietal encephalomalacia with ex vacuo dilatation LEFT lateral ventricle, no hydrocephalus. Probable LEFT cerebral peduncle volume loss compatible with wallerian degeneration. Patchy supratentorial white matter hypodensities exclusive of the aforementioned abnormality. Moderate calcific atherosclerosis of the carotid siphons and included vertebral arteries. No skull fracture. Sessile LEFT frontal bony excrescence measuring up to 6 mm in depth without mass effect may represent meningioma. Imaged  paranasal sinuses and mastoid air cells are well aerated. Ocular globes and orbital contents are normal. IMPRESSION: Large amount of subarachnoid hemorrhage about the circle of Willis extends into the interhemispheric fissure suggesting ruptured anterior communicating artery aneurysm. 3.9 x 1 cm LEFT inferior frontal lobe intraparenchymal hematoma, which may be posttraumatic or, intraparenchymal dissection of ruptured aneurysm. 4 mm bifrontal acute subdural hematomas without mass effect. Acute findings discussed with and reconfirmed by [facility] REES on 04/14/2015 at 9:55 pm. Electronically Signed   By: Awilda Metro M.D.   On: 04/14/2015 21:59   Dg Chest Port 1 View  04/14/2015  CLINICAL DATA:  Chest pain and altered mental status EXAM: PORTABLE CHEST 1 VIEW COMPARISON:  None. FINDINGS: Cardiac shadow is enlarged. The lungs are well aerated with mild vascular congestion. Mild increased density is noted in the medial right lung base likely representing atelectasis or early infiltrate. No bony abnormality is noted. IMPRESSION: Mild vascular congestion and early right basilar changes. Electronically Signed   By: Alcide Clever M.D.   On: 04/14/2015 21:56    Chart has been reviewed  Family  at  Bedside  plan of care was discussed with   Daughter   Assessment/Plan  3 year old gentleman history of hypertension and diabetes presents with severe headache was found to have subarachnoid bleed secondary to pseudoaneurysm thought not to be a candidate for intervention secondary to poor vasculature as well  as multiple  Comorbidities admitted to MedSurg bed for comfort care only  Present on Admission:  . Hypertension - unable to administer by mouth medications patient's comfort care only at this point  Subarachnoid bleed pain management comfort care, paliative care consult DM 2 - at this patient has not been taking his medications at home given comfort care only will await she Accu-Cheks  Prophylaxis:  SCD    CODE STATUS:   DNR/DNI as per patient  And family  Disposition: Very poor prognosis expect patient to expire within  hours to days  Other plan as per orders.  I have spent a total of 45 min on this admission    Chad Villarreal 04/15/2015, 1:19 AM    [facility]  Pager [phone]   after 2 AM please page floor coverage PA If 7AM-7PM, please contact the day team taking care of the patient  [email or web address]  Password [email or web address]

## 2015-04-16 DIAGNOSIS — Z515 Encounter for palliative care: Secondary | ICD-10-CM

## 2015-04-16 NOTE — Progress Notes (Signed)
PROGRESS NOTE    Chad Villarreal  WUJ:[id number]  DOB: April 29, 1945  DOA: 04/14/2015 PCP: No primary care provider on file. Outpatient Specialists:   Hospital course: 70 year old male with history of prior stroke, right hemiparesis & aphagia, bed bound for several years, DM, HTN, HLD, presented to [facility] ED on 2/24 with complaints suggestive of headache and decreased consciousness. 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 Neurosurgery consulted in ED and determined that he was not the reasonable surgical candidate. Family discussed this with patient and he indicated to them that he did not want any aggressive treatment. Patient was transitioned to comfort care.   Assessment & Plan:   Subarachnoid hemorrhage secondary to left frontal orbital pseudoaneurysm - Neurosurgery evaluated in ED and patient not a surgical candidate. Transitioned to palliative care. Requested social work consultation for home hospice. - Patient can probably discharge home without Keppra  Hypertension - No aggressive intervention  DM - No aggressive intervention  DVT prophylaxis: SCDs Code Status: DO NOT RESUSCITATE Family Communication: Discussed with spouse at bedside 2/26 Disposition Plan: Home with hospice, possibly 2/27   Consultants:  Neurosurgery  Procedures:  None  Antimicrobials:  None   Subjective: Somnolent, intermittently wakes and goes back to sleep. Aphasic. Does not follow instructions. As per spouse at bedside, had some headache last night relieved with pain medications.  Objective: Filed Vitals:   04/15/15 0225 04/15/15 1456 04/15/15 2130 04/16/15 0506  BP: 163/98 134/92 149/90 148/100  Pulse: 125 118 59 117  Temp: 97.9 F (36.6 C) 98.2 F (36.8 C) 98.6 F (37 C) 98.9 F (37.2 C)  TempSrc: Axillary Oral Oral Oral  Resp: Weight: 83 kg (182 lb 15.7 oz)     SpO2: 100% 100% 100% 100%    Intake/Output Summary (Last 24  hours) at 04/16/15 1016 Last data filed at 04/15/15 1902  Gross per 24 hour  Intake    222 ml  Output   1150 ml  Net   -928 ml   Filed Weights   04/15/15 0225  Weight: 83 kg (182 lb 15.7 oz)    Exam:  General exam: Elderly male lying comfortably in bed. No distress noted. Respiratory system: Reduced breath sounds. No increased work of breathing. ? Chyne Stokes respirations. Cardiovascular system: S1 & S2 heard, RRR. No JVD, murmurs, gallops, clicks or pedal edema. Gastrointestinal system: Abdomen is nondistended, soft and nontender. Normal bowel sounds heard. Central nervous system: Somnolent, intermittently wakes and goes back to sleep. Aphasic. Does not follow instructions. Extremities: Symmetric 5 x 5 power. Contracted right upper extremity   Data Reviewed: Basic Metabolic Panel:  Recent Labs Lab 04/14/15 2146 04/14/15 2305  NA 144 141  K 3.2* 3.6  CL 106 107  CO2  --  20*  GLUCOSE 167* 183*  BUN 15 12  CREATININE 1.10 0.88  CALCIUM  --  9.2   Liver Function Tests:  Recent Labs Lab 04/14/15 2305  AST 21  ALT 16*  ALKPHOS 79  BILITOT 1.1  PROT 6.8  ALBUMIN 3.6   No results for input(s): LIPASE, AMYLASE in the last 168 hours. No results for input(s): AMMONIA in the last 168 hours. CBC:  Recent Labs Lab 04/14/15 2146 04/14/15 2305  WBC  --  5.9  NEUTROABS  --  4.2  HGB 17.0 15.4  HCT 50.0 45.9  MCV  --  90.4  PLT  --  140*   Cardiac Enzymes: No results  for input(s): CKTOTAL, CKMB, CKMBINDEX, TROPONINI in the last 168 hours. BNP (last 3 results) No results for input(s): PROBNP in the last 8760 hours. CBG: No results for input(s): GLUCAP in the last 168 hours.  No results found for this or any previous visit (from the past 240 hour(s)).       Studies: Ct Angio Head W/cm &/or Wo Cm  04/14/2015  CLINICAL DATA:  Follow-up intracranial hemorrhage. History of diabetes, hypertension and hyperlipidemia. EXAM: CT ANGIOGRAPHY HEAD TECHNIQUE:  Multidetector CT imaging of the head was performed using the standard protocol during bolus administration of intravenous contrast. Multiplanar CT image reconstructions and MIPs were obtained to evaluate the vascular anatomy. CONTRAST:  50mL OMNIPAQUE IOHEXOL 350 MG/ML SOLN COMPARISON:  CT head April 14, 2015 at 2146 hours FINDINGS: Anterior circulation: LEFT internal carotid artery is occluded throughout the included cervical and intracranial segments. Small amount of retrograde flow LEFT carotid terminus. Moderate calcific atherosclerosis of RIGHT carotid bulb, with moderate stenosis RIGHT cavernous segment. Moderate luminal irregularity patent RIGHT A1 segment, with focal high-grade stenosis RIGHT anterior cerebral artery. High-grade stenosis versus occluded LEFT A1 segment, difficult to assess due to subarachnoid hemorrhage. Moderate luminal irregularity RIGHT middle cerebral artery, with tandem high-grade stenosis RIGHT M2 segment. Moderate to high-grade stenosis LEFT M1 origin, with tandem high-grade stenosis LEFT M2 and M3 segments. Posterior circulation: Occluded RIGHT vertebral artery, from included RIGHT V3 through RIGHT V4 segment, with minimal retrograde flow distal RIGHT V4 segment. High-grade stenosis distal LEFT V4 segment. Basilar artery occlusion with retrograde flow via probable small LEFT posterior communicating artery. High-grade stenosis distal basilar artery. Thready flow bilateral posterior cerebral arteries without large vessel occlusion. 7 mm LEFT inferior frontal lobe rounded density with nipple sign posteriorly directed extending into the LEFT olfactory recess. IMPRESSION: 7 mm LEFT inferior frontal lobe density with nipple sign compatible with source of hemorrhage. Consider location this is concerning for pseudo aneurysm, less likely vascular malformation. 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 Patent LEFT posterior  communicating artery. Multifocal high-grade stenosis compatible with atherosclerosis. Electronically Signed   By: Awilda Metro M.D.   On: 04/14/2015 23:04   Ct Head Wo Contrast  04/14/2015  CLINICAL DATA:  Headache, vomiting and lethargy. History of old stroke, RIGHT-sided deficits at baseline. History of hypertension, hyperlipidemia and diabetes. EXAM: CT HEAD WITHOUT CONTRAST TECHNIQUE: Contiguous axial images were obtained from the base of the skull through the vertex without intravenous contrast. COMPARISON:  None. FINDINGS: Large amount of subarachnoid hemorrhage about the circle of Willis, extending into the LEFT greater than RIGHT sylvian fissures, and along the interhemispheric fissure. Acute bilateral dense frontal subdural hematomas measuring up to 4 mm without mass effect. 3.9 x 1 cm LEFT inferior frontal lobe intraparenchymal hematoma. No midline shift. Large area of LEFT frontoparietal encephalomalacia with ex vacuo dilatation LEFT lateral ventricle, no hydrocephalus. Probable LEFT cerebral peduncle volume loss compatible with wallerian degeneration. Patchy supratentorial white matter hypodensities exclusive of the aforementioned abnormality. Moderate calcific atherosclerosis of the carotid siphons and included vertebral arteries. No skull fracture. Sessile LEFT frontal bony excrescence measuring up to 6 mm in depth without mass effect may represent meningioma. Imaged paranasal sinuses and mastoid air cells are well aerated. Ocular globes and orbital contents are normal. IMPRESSION: Large amount of subarachnoid hemorrhage about the circle of Willis extends into the interhemispheric fissure suggesting ruptured anterior communicating artery aneurysm. 3.9 x 1 cm LEFT inferior frontal lobe intraparenchymal hematoma, which may be posttraumatic  or, intraparenchymal dissection of ruptured aneurysm. 4 mm bifrontal acute subdural hematomas without mass effect. Acute findings discussed with and  reconfirmed by [facility] REES on 04/14/2015 at 9:55 pm. Electronically Signed   By: Awilda Metro M.D.   On: 04/14/2015 21:59   Dg Chest Port 1 View  04/14/2015  CLINICAL DATA:  Chest pain and altered mental status EXAM: PORTABLE CHEST 1 VIEW COMPARISON:  None. FINDINGS: Cardiac shadow is enlarged. The lungs are well aerated with mild vascular congestion. Mild increased density is noted in the medial right lung base likely representing atelectasis or early infiltrate. No bony abnormality is noted. IMPRESSION: Mild vascular congestion and early right basilar changes. Electronically Signed   By: Alcide Clever M.D.   On: 04/14/2015 21:56        Scheduled Meds: . levETIRAcetam  500 mg Intravenous Q12H  . sodium chloride flush  3 mL Intravenous Q12H   Continuous Infusions:   Active Problems:   Diabetes mellitus (HCC)   Hypertension   Intracranial bleed (HCC)   SAH (subarachnoid hemorrhage) (HCC)   Subarachnoid bleed (HCC)    Time spent: 20 minutes    Elizardo Chilson, MD, FACP, FHM. [facility] Pager [phone]  If 7PM-7AM, please contact night-coverage [email or web address] Password [email or web address] 04/16/2015, 10:16 AM    LOS: 1 day

## 2015-04-17 MED ORDER — LORAZEPAM 2 MG/ML PO CONC: 1.0000 mg | ORAL | Status: AC | PRN

## 2015-04-17 MED ORDER — LEVETIRACETAM 500 MG PO TABS: 500.0000 mg | ORAL_TABLET | Freq: Two times a day (BID) | ORAL | Status: DC

## 2015-04-17 MED ORDER — MORPHINE SULFATE (CONCENTRATE) 10 MG/0.5ML PO SOLN: 5.0000 mg | ORAL | Status: DC | PRN

## 2015-04-17 MED ORDER — OXYCODONE HCL 5 MG PO TABS
5.0000 mg | ORAL_TABLET | ORAL | Status: DC | PRN
  Administered 2015-04-17: 10 mg via ORAL
  Filled 2015-04-17 (×2): qty 2

## 2015-04-17 MED ORDER — LEVETIRACETAM 500 MG PO TABS
500.0000 mg | ORAL_TABLET | Freq: Two times a day (BID) | ORAL | Status: DC
  Administered 2015-04-17: 500 mg via ORAL
  Filled 2015-04-17: qty 1

## 2015-04-17 MED ORDER — LEVETIRACETAM 100 MG/ML PO SOLN: 500.0000 mg | Freq: Two times a day (BID) | ORAL | Status: AC

## 2015-04-17 MED ORDER — MORPHINE SULFATE (CONCENTRATE) 10 MG/0.5ML PO SOLN: 5.0000 mg | ORAL | Status: AC | PRN

## 2015-04-17 MED ORDER — LEVETIRACETAM 100 MG/ML PO SOLN
500.0000 mg | Freq: Two times a day (BID) | ORAL | Status: DC
  Filled 2015-04-17: qty 5

## 2015-04-17 MED ORDER — LORAZEPAM 2 MG/ML PO CONC: 1.0000 mg | ORAL | Status: DC | PRN

## 2015-04-17 NOTE — Progress Notes (Signed)
Discharge instructions and prescriptions given to patient's wife. Patient discharged by ambulance to home in [place].

## 2015-04-17 NOTE — Discharge Summary (Signed)
Physician Discharge Summary  Chad Villarreal  OZD:[id number]  DOB: [birth year]  DOA: 04/14/2015  PCP: No primary care provider on file.  Admit date: 04/14/2015 Discharge date: 04/17/2015  Time spent: Greater than 30 minutes  Recommendations for Outpatient Follow-up:  1. Dr. Vickey Huger in [facility], [place]/PCP. Call when necessary 2. [facility]: The will call patient's family to arrange follow-up from 04/17/68  Discharge Diagnoses:  Active Problems:   Diabetes mellitus (HCC)   Hypertension   Intracranial bleed (HCC)   SAH (subarachnoid hemorrhage) (HCC)   Subarachnoid bleed (HCC)   Discharge Condition: Improved & Stable  Diet recommendation: Regular diet.  Filed Weights   04/15/15 0225  Weight: 83 kg (182 lb 15.7 oz)    History of present illness & Hospital course:  70 year old male with history of prior stroke, right hemiparesis & aphasia, bed bound for several years (moved around with the help of an electric wheelchair), DM, HTN, HLD, presented to [facility] ED on 2/24 with complaints suggestive of headache and decreased consciousness. 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 Neurosurgery consulted in ED and determined that he was not the reasonable surgical candidate. Family discussed this with patient and he indicated to them that he did not want any aggressive treatment and wished to have comfort care only. He was admitted to medical floor. His mental status waxes and wanes. He complains of intermittent headache. Oral intake is not consistent. Started on Keppra for seizure prophylaxis although he has not had any seizures. Only medications pertinent to comfort with continued and rest were discontinued. Discussed extensively with patient's spouse and daughter at bedside today. Reiterated poor overall prognosis. They discussed amongst themselves and extended family and confirmed their wishes to take patient home with hospice.  Discussed with case management who has arranged for home hospice who will assess him tomorrow. Patient is DO NOT RESUSCITATE.   Procedures:  Neurosurgery.    Discharge Exam:  Complaints:  Somnolent but arousable to touch. Aphasic but attempts to say something. As per spouse at bedside, had a difficult night with complaints of headache, loss of IV access.  Filed Vitals:   04/15/15 2130 04/16/15 0506 04/16/15 1445 04/17/15 0542  BP: 149/90 148/100 142/93 142/82  Pulse: 59 117 64 79  Temp: 98.6 F (37 C) 98.9 F (37.2 C) 99.6 F (37.6 C) 98.6 F (37 C)  TempSrc: Oral Oral Oral Oral  Resp: Weight:      SpO2: 100% 100% 97% 97%    General exam: Elderly male lying comfortably in bed. No distress noted. Respiratory system: Reduced breath sounds. No increased work of breathing. Cardiovascular system: S1 & S2 heard, RRR. No JVD, murmurs, gallops, clicks or pedal edema. Gastrointestinal system: Abdomen is nondistended, soft and nontender. Normal bowel sounds heard. Central nervous system:  Somnolent but arousable to touch. Aphasic but attempts to say something. Does not follow instructions. Extremities: Symmetric 5 x 5 power. Contracted right upper extremity  Discharge Instructions      Discharge Instructions    Call MD for:  difficulty breathing, headache or visual disturbances    Complete by:  As directed      Call MD for:  extreme fatigue    Complete by:  As directed      Call MD for:  persistant dizziness or light-headedness    Complete by:  As directed      Call MD for:  persistant nausea and vomiting  Complete by:  As directed      Call MD for:  severe uncontrolled pain    Complete by:  As directed      Call MD for:  temperature >100.4    Complete by:  As directed      Diet general    Complete by:  As directed      Increase activity slowly    Complete by:  As directed             Medication List    STOP taking these medications        amLODipine  5 MG tablet  Commonly known as:  NORVASC     atorvastatin 20 MG tablet  Commonly known as:  LIPITOR     clopidogrel 75 MG tablet  Commonly known as:  PLAVIX     insulin aspart 100 UNIT/ML injection  Commonly known as:  novoLOG     metFORMIN 1000 MG tablet  Commonly known as:  GLUCOPHAGE     MUCINEX DM 30-600 MG Tb12     multivitamin with minerals Tabs tablet     PROBIOTIC FORMULA Caps     sertraline 50 MG tablet  Commonly known as:  ZOLOFT     TRESIBA FLEXTOUCH 100 UNIT/ML Sopn  Generic drug:  Insulin Degludec      TAKE these medications        albuterol (2.5 MG/3ML) 0.083% nebulizer solution  Commonly known as:  PROVENTIL  Take 2.5 mg by nebulization every 6 (six) hours as needed for wheezing or shortness of breath.     levETIRAcetam 100 MG/ML solution  Commonly known as:  KEPPRA  Take 5 mLs (500 mg total) by mouth 2 (two) times daily.     LORazepam 2 MG/ML concentrated solution  Commonly known as:  ATIVAN  Take 0.5 mLs (1 mg total) by mouth every 4 (four) hours as needed for anxiety, seizure, sedation or sleep.     morphine CONCENTRATE 10 MG/0.5ML Soln concentrated solution  Take 0.25 mLs (5 mg total) by mouth every 4 (four) hours as needed for moderate pain, severe pain or shortness of breath.       Follow-up Information    Schedule an appointment as soon as possible for a visit with Dr. Vickey Huger in [place], [place].   Why:  As needed, If symptoms worsen      Follow up with [facility].   Why:  They will follow up with you on 04/18/2015.      Get Medicines reviewed and adjusted: Please take all your medications with you for your next visit with your Primary MD  Please request your Primary MD to go over all hospital tests and procedure/radiological results at the follow up. Please ask your Primary MD to get all Hospital records sent to his/her office.  If you experience worsening of your admission symptoms, develop shortness of  breath, life threatening emergency, suicidal or homicidal thoughts you must seek medical attention immediately by calling 911 or calling your MD immediately if symptoms less severe.  You must read complete instructions/literature along with all the possible adverse reactions/side effects for all the Medicines you take and that have been prescribed to you. Take any new Medicines after you have completely understood and accept all the possible adverse reactions/side effects.   Do not drive when taking pain medications.   Do not take more than prescribed Pain, Sleep and Anxiety Medications  Special Instructions: If you have  smoked or chewed Tobacco in the last 2 yrs please stop smoking, stop any regular Alcohol and or any Recreational drug use.  Wear Seat belts while driving.  Please note  You were cared for by a hospitalist during your hospital stay. Once you are discharged, your primary care physician will handle any further medical issues. Please note that NO REFILLS for any discharge medications will be authorized once you are discharged, as it is imperative that you return to your primary care physician (or establish a relationship with a primary care physician if you do not have one) for your aftercare needs so that they can reassess your need for medications and monitor your lab values.    The results of significant diagnostics from this hospitalization (including imaging, microbiology, ancillary and laboratory) are listed below for reference.    Significant Diagnostic Studies: Ct Angio Head W/cm &/or Wo Cm  04/14/2015  CLINICAL DATA:  Follow-up intracranial hemorrhage. History of diabetes, hypertension and hyperlipidemia. EXAM: CT ANGIOGRAPHY HEAD TECHNIQUE: Multidetector CT imaging of the head was performed using the standard protocol during bolus administration of intravenous contrast. Multiplanar CT image reconstructions and MIPs were obtained to evaluate the vascular anatomy.  CONTRAST:  50mL OMNIPAQUE IOHEXOL 350 MG/ML SOLN COMPARISON:  CT head April 14, 2015 at 2146 hours FINDINGS: Anterior circulation: LEFT internal carotid artery is occluded throughout the included cervical and intracranial segments. Small amount of retrograde flow LEFT carotid terminus. Moderate calcific atherosclerosis of RIGHT carotid bulb, with moderate stenosis RIGHT cavernous segment. Moderate luminal irregularity patent RIGHT A1 segment, with focal high-grade stenosis RIGHT anterior cerebral artery. High-grade stenosis versus occluded LEFT A1 segment, difficult to assess due to subarachnoid hemorrhage. Moderate luminal irregularity RIGHT middle cerebral artery, with tandem high-grade stenosis RIGHT M2 segment. Moderate to high-grade stenosis LEFT M1 origin, with tandem high-grade stenosis LEFT M2 and M3 segments. Posterior circulation: Occluded RIGHT vertebral artery, from included RIGHT V3 through RIGHT V4 segment, with minimal retrograde flow distal RIGHT V4 segment. High-grade stenosis distal LEFT V4 segment. Basilar artery occlusion with retrograde flow via probable small LEFT posterior communicating artery. High-grade stenosis distal basilar artery. Thready flow bilateral posterior cerebral arteries without large vessel occlusion. 7 mm LEFT inferior frontal lobe rounded density with nipple sign posteriorly directed extending into the LEFT olfactory recess. IMPRESSION: 7 mm LEFT inferior frontal lobe density with nipple sign compatible with source of hemorrhage. Consider location this is concerning for pseudo aneurysm, less likely vascular malformation. 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 Patent LEFT posterior communicating artery. Multifocal high-grade stenosis compatible with atherosclerosis. Electronically Signed   By: Awilda Metro M.D.   On: 04/14/2015 23:04   Ct Head Wo Contrast  04/14/2015  CLINICAL DATA:  Headache, vomiting and  lethargy. History of old stroke, RIGHT-sided deficits at baseline. History of hypertension, hyperlipidemia and diabetes. EXAM: CT HEAD WITHOUT CONTRAST TECHNIQUE: Contiguous axial images were obtained from the base of the skull through the vertex without intravenous contrast. COMPARISON:  None. FINDINGS: Large amount of subarachnoid hemorrhage about the circle of Willis, extending into the LEFT greater than RIGHT sylvian fissures, and along the interhemispheric fissure. Acute bilateral dense frontal subdural hematomas measuring up to 4 mm without mass effect. 3.9 x 1 cm LEFT inferior frontal lobe intraparenchymal hematoma. No midline shift. Large area of LEFT frontoparietal encephalomalacia with ex vacuo dilatation LEFT lateral ventricle, no hydrocephalus. Probable LEFT cerebral peduncle volume loss compatible with wallerian degeneration. Patchy supratentorial white matter hypodensities exclusive of  the aforementioned abnormality. Moderate calcific atherosclerosis of the carotid siphons and included vertebral arteries. No skull fracture. Sessile LEFT frontal bony excrescence measuring up to 6 mm in depth without mass effect may represent meningioma. Imaged paranasal sinuses and mastoid air cells are well aerated. Ocular globes and orbital contents are normal. IMPRESSION: Large amount of subarachnoid hemorrhage about the circle of Willis extends into the interhemispheric fissure suggesting ruptured anterior communicating artery aneurysm. 3.9 x 1 cm LEFT inferior frontal lobe intraparenchymal hematoma, which may be posttraumatic or, intraparenchymal dissection of ruptured aneurysm. 4 mm bifrontal acute subdural hematomas without mass effect. Acute findings discussed with and reconfirmed by [facility] on 04/14/2015 at 9:55 pm. Electronically Signed   By: Awilda Metro M.D.   On: 04/14/2015 21:59   Dg Chest Port 1 View  04/14/2015  CLINICAL DATA:  Chest pain and altered mental status EXAM: PORTABLE CHEST 1  VIEW COMPARISON:  None. FINDINGS: Cardiac shadow is enlarged. The lungs are well aerated with mild vascular congestion. Mild increased density is noted in the medial right lung base likely representing atelectasis or early infiltrate. No bony abnormality is noted. IMPRESSION: Mild vascular congestion and early right basilar changes. Electronically Signed   By: Alcide Clever M.D.   On: 04/14/2015 21:56    Microbiology: No results found for this or any previous visit (from the past 240 hour(s)).   Labs: Basic Metabolic Panel:  Recent Labs Lab 04/14/15 2146 04/14/15 2305  NA 144 141  K 3.2* 3.6  CL 106 107  CO2  --  20*  GLUCOSE 167* 183*  BUN 15 12  CREATININE 1.10 0.88  CALCIUM  --  9.2   Liver Function Tests:  Recent Labs Lab 04/14/15 2305  AST 21  ALT 16*  ALKPHOS 79  BILITOT 1.1  PROT 6.8  ALBUMIN 3.6   No results for input(s): LIPASE, AMYLASE in the last 168 hours. No results for input(s): AMMONIA in the last 168 hours. CBC:  Recent Labs Lab 04/14/15 2146 04/14/15 2305  WBC  --  5.9  NEUTROABS  --  4.2  HGB 17.0 15.4  HCT 50.0 45.9  MCV  --  90.4  PLT  --  140*   Cardiac Enzymes: No results for input(s): CKTOTAL, CKMB, CKMBINDEX, TROPONINI in the last 168 hours. BNP: BNP (last 3 results) No results for input(s): BNP in the last 8760 hours.  ProBNP (last 3 results) No results for input(s): PROBNP in the last 8760 hours.  CBG: No results for input(s): GLUCAP in the last 168 hours.     Signed:  Marcellus Scott, MD, FACP, FHM. [facility] Pager [phone]  If 7PM-7AM, please contact night-coverage [email or web address] Password [facility] 04/17/2015, 3:03 PM

## 2015-04-17 NOTE — Care Management Note (Signed)
Case Management Note  Patient Details  Name: Chad Villarreal MRN: [id number] Date of Birth: [birth year]  Subjective/Objective:  Pt admitted on 04/14/15 with [facility] r/t HTN crisis.  PTA, pt lives with and is cared for by spouse.  Pt was here visiting daughter, who lives in [place].   PMH of stroke.                   Action/Plan: Pt with poor prognosis.  Met with pt's wife and daughter at bedside to discuss dc plans.  Family desires home follow up with [facility] in [place], [place] near [place].    [facility] agency [phone]); Will fax referral to [phone], attention Philis Fendt.  Nurse will follow up with pt at home in AM.  Pt will discharge home via ambulance transfer, per family's wishes, and per safety.     Expected Discharge Date:  04/17/15               Expected Discharge Plan:  Home w Hospice Care  In-House Referral:     Discharge planning Services  CM Consult  Post Acute Care Choice:  Hospice Choice offered to:  Spouse, Adult Children  DME Arranged:    DME Agency:     HH Arranged:    [place] Agency:  Other - See comment  Status of Service:  Completed, signed off  Medicare Important Message Given:    Date Medicare IM Given:    Medicare IM give by:    Date Additional Medicare IM Given:    Additional Medicare Important Message give by:     If discussed at [place] of Stay Meetings, dates discussed:    Additional Comments:  [facility] unable to transport pt.  Nutritional therapist...they have a truck in the area, and will be able to transport pt to his home in [place], [place], near [place].  Notified patient's spouse and she is pleased.  She plans to go on ahead to be there when they arrive.  Pt's address is [address], [place], [place] [address].   Medical necessity form completed.    Reinaldo Raddle, RN, BSN  Trauma/Neuro ICU Case Manager [phone]

## 2015-04-17 NOTE — Discharge Instructions (Signed)
Subarachnoid Hemorrhage Subarachnoid hemorrhage is bleeding in the area between the brain and the membrane that covers the brain (subarachnoid space). This increases the pressure on the brain and causes some areas of the brain to be deprived of blood flow. Subarachnoid hemorrhage is a medical emergency that may cause permanent brain damage, stroke, or even death if not treated.  CAUSES   Head injury.   Ruptured brain aneurysm.   Bleeding from blood vessels that develop abnormally (arteriovenous malformation).   Bleeding disorder.   Use of blood thinners (anticoagulants).  Use of certain drugs, such as cocaine. For some people with subarachnoid hemorrhage, the cause is unknown.  RISK FACTORS  Smoking.  Having high blood pressure (hypertension).  Abusing alcohol.  Being a male, especially being of post-menopausal age.  Having a family history of disease in the blood vessels of the brain (cerebrovascular disease).  Having certain genetic syndromes that result in kidney disease or connective tissue disease. SIGNS AND SYMPTOMS   A sudden, severe headache with no known cause. The headache is often described as the worst headache ever experienced.  Nausea or vomiting, especially when combined with other symptoms such as a headache.  Sudden weakness or numbness of the face, arm, or leg, especially on one side of the body.  Sudden trouble walking or difficulty moving arms or legs.  Sudden confusion.  Sudden personality changes.  Trouble speaking (aphasia) or understanding.  Difficulty swallowing.  Sudden trouble seeing in one or both eyes.  Double vision.  Dizziness.  Loss of balance or coordination.  Intolerance to light.  Stiff neck. DIAGNOSIS  Your health care provider will perform a physical exam and ask about your symptoms. If a subarachnoid hemorrhage is suspected, various tests may be ordered. These tests may include:   A CT scan.  An MRI.  A  cerebral angiogram.  A spinal tap (lumbar puncture).  Blood tests. TREATMENT  Immediate treatment in the hospital is often required to reduce the risk of brain damage. Treatment will depend on the cause of the bleeding, where it is located, and the extent of the bleeding and damage. The goals of treatment include stopping the bleeding, repairing the cause of bleeding, providing relief of symptoms, and preventing problems.   Medicines may be given to:  Lower blood pressure (antihypertensives).  Relieve pain (analgesics).  Relieve nausea or vomiting.  Surgery may also be needed to stop the bleeding, repair the cause of the bleeding, or remove the blood.  Rehabilitation may be needed to improve any cognitive and day-to-day functions impaired by the condition. Further treatment depends on the duration, severity, and cause of your symptoms. Physical, speech, and occupational therapists will assess you and work to improve any functions impaired by the subarachnoid hemorrhage. Measures will be taken to prevent short-term and long-term problems, including infection from breathing foreign material into the lungs (aspiration pneumonia), blood clots in the legs, bedsores, and falls. HOME CARE INSTRUCTIONS After your hospitalization or inpatient rehabilitation is completed and you are well enough to go home, it is important to prevent a reoccurrence. Take these steps to help prevent this:  Take medicines only as directed by your health care provider.  If swallow studies have determined that your swallowing reflex is present, you should eat healthy foods. A diet low in salt (sodium), saturated fat, trans fat, and cholesterol may be recommended to manage high blood pressure. Foods may need to be a special consistency (soft or pureed), or small bites may need to be   taken in order to avoid aspirating or choking.  Rest and limit activities or movements as directed by your health care provider.  Do not  use any tobacco products including cigarettes, chewing tobacco, or electronic cigarettes. If you need help quitting, ask your health care provider.  Limit alcohol intake to no more than 1 drink per day for nonpregnant women and 2 drinks per day for men. One drink equals 12 ounces of beer, 5 ounces of wine, or 1 ounces of hard liquor.  Make any other lifestyle changes as directed by your health care provider.  Monitor and record your blood pressure as directed by your health care provider.  A safe home environment is important to reduce the risk of falls. Your health care provider may arrange for specialists to evaluate your home. Having grab bars in the bedroom and bathroom is often important. Your health care provider may arrange for special equipment to be used at home, such as raised toilets and a seat for the shower.  Physical, occupational, and speech therapy. Ongoing therapy may be needed to maximize your recovery after a subarachnoid bleed. If you have been advised to use a walker or a cane, use it at all times. Be sure to keep your therapy appointments.  Keep all follow-up visits with your health care provider and other specialists. This includes any referrals, physical therapy, and rehabilitation. SEEK IMMEDIATE MEDICAL CARE IF:   You suddenly have a sudden, severe headache with no known cause.  You have nausea or vomiting occurring with another symptom.  You have sudden weakness or numbness of the face, arm, or leg, especially on one side of the body.  You have sudden trouble walking or difficulty moving arms or legs.  You have sudden confusion.  You have trouble speaking (aphasia) or understanding.  You have sudden trouble seeing in one or both eyes.  You have a sudden loss of balance or coordination.  You have a stiff neck.  You have difficulty breathing.  You have a partial or total loss of consciousness. Any of these symptoms may represent a serious problem that is  an emergency. Do not wait to see if the symptoms will go away. Get medical help right away. Call your local emergency services (911 in [place] Do not drive yourself to the hospital.   This information is not intended to replace advice given to you by your health care provider. Make sure you discuss any questions you have with your health care provider.   Document Released: 12/23/2003 Document Revised: 10/26/2014 Document Reviewed: 03/20/2012 Elsevier Interactive Patient Education 2016 [employer] Inc.  

## 2015-05-20 DEATH — deceased

## 2017-06-06 IMAGING — CT CT ANGIO HEAD
1 of 10 series · 1 of 33 positions shown · IV contrast (omnipaque)
Comparison: CT head April 14, 2015 at 1402 hours

CLINICAL DATA: Follow-up intracranial hemorrhage. History of
diabetes, hypertension and hyperlipidemia.

EXAM:
CT ANGIOGRAPHY HEAD
TECHNIQUE: Multidetector CT imaging of the head was performed using the
standard protocol during bolus administration of intravenous
contrast. Multiplanar CT image reconstructions and MIPs were
obtained to evaluate the vascular anatomy.
CONTRAST:  50mL OMNIPAQUE IOHEXOL 350 MG/ML SOLN

[Series 200: locator · axial · 0.33mm/px · 1 of 1 slices shown]
[im 1/1  soft-tissue]
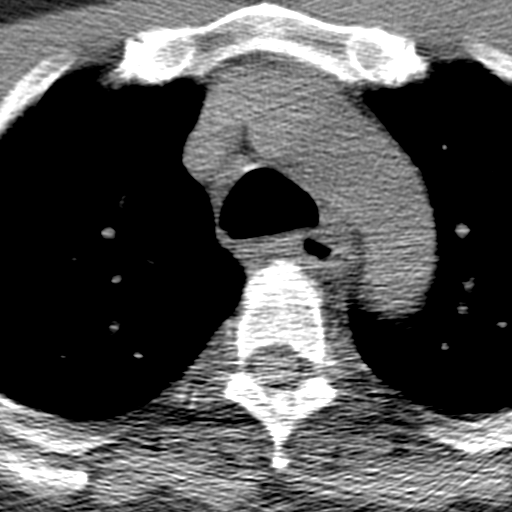

[1 of 33 positions shown; findings below may reference images not displayed]

FINDINGS: Anterior circulation: LEFT internal carotid artery is occluded
throughout the included cervical and intracranial segments. Small
amount of retrograde flow LEFT carotid terminus. Moderate calcific
atherosclerosis of RIGHT carotid bulb, with moderate stenosis RIGHT
cavernous segment. Moderate luminal irregularity patent RIGHT A1
segment, with focal high-grade stenosis RIGHT anterior cerebral
artery. High-grade stenosis versus occluded LEFT A1 segment,
difficult to assess due to subarachnoid hemorrhage. Moderate luminal
irregularity RIGHT middle cerebral artery, with tandem high-grade
stenosis RIGHT M2 segment. Moderate to high-grade stenosis LEFT M1
origin, with tandem high-grade stenosis LEFT M2 and M3 segments.

Posterior circulation: Occluded RIGHT vertebral artery, from
included RIGHT V3 through RIGHT V4 segment, with minimal retrograde
flow distal RIGHT V4 segment. High-grade stenosis distal LEFT V4
segment. Basilar artery occlusion with retrograde flow via probable
small LEFT posterior communicating artery. High-grade stenosis
distal basilar artery. Thready flow bilateral posterior cerebral
arteries without large vessel occlusion.

7 mm LEFT inferior frontal lobe rounded density with nipple sign
posteriorly directed extending into the LEFT olfactory recess.
IMPRESSION: 7 mm LEFT inferior frontal lobe density with nipple sign compatible
with source of hemorrhage. Consider location this is concerning for
pseudo aneurysm, less likely vascular malformation.

Occluded RIGHT internal carotid artery.

Occluded RIGHT vertebral artery.

Occluded basilar artery. Patent LEFT posterior communicating artery.

Multifocal high-grade stenosis compatible with atherosclerosis.
